# Patient Record
Sex: Male | Born: 1975 | Race: Black or African American | Hispanic: No | Marital: Single | Smoking: Former smoker
Health system: Southern US, Community
[De-identification: ages and names within clinical notes are randomized; demographics above are authoritative.]

---

## 2016-08-24 ENCOUNTER — Observation Stay (HOSPITAL_COMMUNITY)
Admission: EM | Admit: 2016-08-24 | Discharge: 2016-08-25 | Disposition: A | Discharge status: Home / Self Care | Payer: Self-pay | Attending: Internal Medicine | Admitting: Internal Medicine

## 2016-08-24 ENCOUNTER — Emergency Department (HOSPITAL_COMMUNITY): Payer: Self-pay

## 2016-08-24 ENCOUNTER — Encounter (HOSPITAL_COMMUNITY): Payer: Self-pay

## 2016-08-24 DIAGNOSIS — R000 Tachycardia, unspecified: Secondary | ICD-10-CM

## 2016-08-24 DIAGNOSIS — Z86711 Personal history of pulmonary embolism: Secondary | ICD-10-CM | POA: Diagnosis present

## 2016-08-24 DIAGNOSIS — J90 Pleural effusion, not elsewhere classified: Secondary | ICD-10-CM | POA: Insufficient documentation

## 2016-08-24 DIAGNOSIS — F17210 Nicotine dependence, cigarettes, uncomplicated: Secondary | ICD-10-CM | POA: Insufficient documentation

## 2016-08-24 DIAGNOSIS — D696 Thrombocytopenia, unspecified: Secondary | ICD-10-CM | POA: Insufficient documentation

## 2016-08-24 DIAGNOSIS — E871 Hypo-osmolality and hyponatremia: Secondary | ICD-10-CM | POA: Insufficient documentation

## 2016-08-24 DIAGNOSIS — I2699 Other pulmonary embolism without acute cor pulmonale: Principal | ICD-10-CM | POA: Insufficient documentation

## 2016-08-24 DIAGNOSIS — K449 Diaphragmatic hernia without obstruction or gangrene: Secondary | ICD-10-CM | POA: Insufficient documentation

## 2016-08-24 DIAGNOSIS — Z720 Tobacco use: Secondary | ICD-10-CM | POA: Diagnosis present

## 2016-08-24 LAB — BASIC METABOLIC PANEL
Anion gap: 10 (ref 5–15)
BUN: 10 mg/dL (ref 6–20)
CALCIUM: 9 mg/dL (ref 8.9–10.3)
CO2: 21 mmol/L — ABNORMAL LOW (ref 22–32)
Chloride: 101 mmol/L (ref 101–111)
Creatinine, Ser: 1.09 mg/dL (ref 0.61–1.24)
GFR calc Af Amer: >60 mL/min (ref >60)
GLUCOSE: 118 mg/dL — AB (ref 65–99)
POTASSIUM: 3.8 mmol/L (ref 3.5–5.1)
SODIUM: 132 mmol/L — AB (ref 135–145)

## 2016-08-24 LAB — CBC
HCT: 44.3 % (ref 39.0–52.0)
Hemoglobin: 15.5 g/dL (ref 13.0–17.0)
MCH: 32.1 pg (ref 26.0–34.0)
MCHC: 35 g/dL (ref 30.0–36.0)
MCV: 91.7 fL (ref 78.0–100.0)
PLATELETS: 141 10*3/uL — AB (ref 150–400)
RBC: 4.83 MIL/uL (ref 4.22–5.81)
RDW: 12.5 % (ref 11.5–15.5)
WBC: 11.9 10*3/uL — AB (ref 4.0–10.5)

## 2016-08-24 LAB — I-STAT TROPONIN, ED: TROPONIN I, POC: 0 ng/mL (ref 0.00–0.08)

## 2016-08-24 LAB — D-DIMER, QUANTITATIVE (NOT AT ARMC): D DIMER QUANT: 2.82 ug{FEU}/mL — AB (ref 0.00–0.50)

## 2016-08-24 MED ORDER — ONDANSETRON HCL 4 MG/2ML IJ SOLN: 4.0000 mg | Freq: Four times a day (QID) | INTRAMUSCULAR | Status: DC | PRN

## 2016-08-24 MED ORDER — FENTANYL CITRATE (PF) 100 MCG/2ML IJ SOLN
50.0000 ug | Freq: Once | INTRAMUSCULAR | Status: AC
  Administered 2016-08-24: 50 ug via INTRAVENOUS
  Filled 2016-08-24: qty 2

## 2016-08-24 MED ORDER — KETOROLAC TROMETHAMINE 30 MG/ML IJ SOLN
30.0000 mg | Freq: Four times a day (QID) | INTRAMUSCULAR | Status: DC | PRN
  Administered 2016-08-24 – 2016-08-25 (×3): 30 mg via INTRAVENOUS
  Filled 2016-08-24 (×3): qty 1

## 2016-08-24 MED ORDER — AZITHROMYCIN 250 MG PO TABS
500.0000 mg | ORAL_TABLET | Freq: Once | ORAL | Status: AC
  Administered 2016-08-24: 500 mg via ORAL
  Filled 2016-08-24: qty 2

## 2016-08-24 MED ORDER — HEPARIN (PORCINE) IN NACL 100-0.45 UNIT/ML-% IJ SOLN
1400.0000 [IU]/h | INTRAMUSCULAR | Status: DC
  Administered 2016-08-24: 1400 [IU]/h via INTRAVENOUS
  Filled 2016-08-24: qty 250

## 2016-08-24 MED ORDER — ONDANSETRON HCL 4 MG PO TABS: 4.0000 mg | ORAL_TABLET | Freq: Four times a day (QID) | ORAL | Status: DC | PRN

## 2016-08-24 MED ORDER — ENOXAPARIN SODIUM 100 MG/ML ~~LOC~~ SOLN
1.0000 mg/kg | Freq: Two times a day (BID) | SUBCUTANEOUS | Status: DC
  Administered 2016-08-24 – 2016-08-25 (×2): 85 mg via SUBCUTANEOUS
  Filled 2016-08-24 (×2): qty 1

## 2016-08-24 MED ORDER — IOPAMIDOL (ISOVUE-370) INJECTION 76%
INTRAVENOUS | Status: AC
  Administered 2016-08-24: 100 mL
  Filled 2016-08-24: qty 100

## 2016-08-24 MED ORDER — HEPARIN BOLUS VIA INFUSION
6000.0000 [IU] | Freq: Once | INTRAVENOUS | Status: AC
  Administered 2016-08-24: 6000 [IU] via INTRAVENOUS
  Filled 2016-08-24: qty 6000

## 2016-08-24 NOTE — ED Notes (Signed)
Attempted report x1. 

## 2016-08-24 NOTE — ED Provider Notes (Signed)
[facility]EMERGENCY DEPT Provider Note   CSN: [id number] Arrival date & time: 08/24/16  0203  By signing my name below, I, Elder Negus, attest that this documentation has been prepared under the direction and in the presence of Zadie Rhine, MD. Electronically Signed: Elder Negus, Scribe. 08/24/16. 3:30 AM.   History   Chief Complaint Chief Complaint  Patient presents with  . Chest Pain    HPI James Dennis is a 41 y.o. male without any chronic medical problems who presents to the ED for evaluation of rib pain. This patient states that he woke from sleep around 3 hours ago with sudden onset of L rib pain that worsens while breathing deeply. However no fevers or chills. No cough or hemoptysis. No trauma. No recent travel or leg swelling. No rhinorrhea or sore throat. He is a tobacco user. His course is worsening His pain is worse with breathing, improved with breath  The history is provided by the patient. No language interpreter was used.    PMH - none Soc hx - smoker Home Medications    Prior to Admission medications   Not on File    Family History No family history on file.  Social History Social History  Substance Use Topics  . Smoking status: Current Every Day Smoker    Packs/day: 0.50  . Smokeless tobacco: Never Used  . Alcohol use Yes     Allergies   Patient has no known allergies.   Review of Systems Review of Systems  Constitutional: Negative for fever.  HENT: Negative for rhinorrhea and sore throat.   Respiratory: Negative for cough and shortness of breath.        L rib pain. No hemoptysis  Cardiovascular: Negative for chest pain (anterior) and leg swelling.  All other systems reviewed and are negative.    Physical Exam Updated Vital Signs BP 127/87 (BP Location: Left Arm)   Pulse 97   Temp 99 F (37.2 C) (Oral)   Resp 18   Ht 5\' 11"  (1.803 m)   Wt 190 lb (86.2 kg)   SpO2 97%   BMI 26.50 kg/m   Physical Exam CONSTITUTIONAL:  Well developed/well nourished HEAD: Normocephalic/atraumatic EYES: EOMI ENMT: Mucous membranes moist NECK: supple no meningeal signs SPINE/BACK:entire spine nontender CV: S1/S2 noted, no murmurs/rubs/gallops noted LUNGS: Lungs are clear to auscultation bilaterally, no apparent distress ABDOMEN: soft, nontender, no rebound or guarding, bowel sounds noted throughout abdomen GU:no cva tenderness NEURO: Pt is awake/alert/appropriate, moves all extremitiesx4.  EXTREMITIES: pulses normal/equal, full ROM, no calf tenderness or edema SKIN: warm, color normal PSYCH: no abnormalities of mood noted, alert and oriented to situation   ED Treatments / Results  Labs (all labs ordered are listed, but only abnormal results are displayed) Labs Reviewed  BASIC METABOLIC PANEL - Abnormal; Notable for the following:       Result Value   Sodium 132 (*)    CO2 21 (*)    Glucose, Bld 118 (*)    All other components within normal limits  CBC - Abnormal; Notable for the following:    WBC 11.9 (*)    Platelets 141 (*)    All other components within normal limits  D-DIMER, QUANTITATIVE (NOT AT [facility]) - Abnormal; Notable for the following:    D-Dimer, Quant 2.82 (*)    All other components within normal limits  HEPARIN LEVEL (UNFRACTIONATED)  Rosezena Sensor, ED    EKG  EKG Interpretation  Date/Time:  Thursday August 24 2016 02:11:11 EDT  Ventricular Rate:  102 PR Interval:  164 QRS Duration: 82 QT Interval:  314 QTC Calculation: 409 R Axis:   71 Text Interpretation:  Sinus tachycardia Cannot rule out Anterior infarct , age undetermined Abnormal ECG No previous ECGs available Confirmed by Bebe ShaggyWICKLINE  MD, Kennedie Pardoe ([id number]) on 08/24/2016 3:24:13 AM       Radiology Dg Chest 2 View  Result Date: 08/24/2016 CLINICAL DATA:  41 y/o  M; left-sided chest pain. EXAM: CHEST  2 VIEW COMPARISON:  None. FINDINGS: Normal cardiac silhouette. Ill-defined left lower lobe consolidation. Probable small left pleural  effusion. No acute osseous abnormality is evident. IMPRESSION: Ill-defined left lower lobe consolidation, probably pneumonia. Small left pleural effusion. These results will be called to the ordering clinician or representative by the Radiologist Assistant, and communication documented in the PACS or zVision Dashboard. Electronically Signed   By: Mitzi HansenLance  Furusawa-Stratton M.D.   On: 08/24/2016 02:48   Ct Angio Chest Pe W And/or Wo Contrast  Result Date: 08/24/2016 CLINICAL DATA:  41 y/o  M; pleuritic chest pain. EXAM: CT ANGIOGRAPHY CHEST WITH CONTRAST TECHNIQUE: Multidetector CT imaging of the chest was performed using the standard protocol during bolus administration of intravenous contrast. Multiplanar CT image reconstructions and MIPs were obtained to evaluate the vascular anatomy. CONTRAST:  71 cc Isovue 370 COMPARISON:  08/24/2016 chest radiograph. FINDINGS: Cardiovascular: Positive for acute pulmonary embolus in multiple lobar and segmental pulmonary arteries bilaterally. RV/LV ratio equals 0.82. Normal caliber thoracic aorta. Normal heart size. No pericardial effusion. Mediastinum/Nodes: No enlarged mediastinal, hilar, or axillary lymph nodes. Thyroid gland, trachea, and esophagus demonstrate no significant findings. Lungs/Pleura: Consolidation within the left lower lobe does appear to have a wedge-shaped configuration on the coronal reconstruction may represent a small pulmonary infarct. Pneumonia is not excluded. Lungs are otherwise clear. Small left pleural effusion. No pneumothorax. Upper Abdomen: Small hiatal hernia. Musculoskeletal: No chest wall abnormality. No acute or significant osseous findings. Review of the MIP images confirms the above findings. IMPRESSION: 1. Positive for acute pulmonary embolus. No right ventricular strain by CT criteria. 2. Left lower lobe consolidation has a wedge-shaped configuration on coronal reconstruction and may represent a small pulmonary infarct. Pneumonia is  also possible. 3. Small hiatal hernia. These results were called by telephone at the time of interpretation on 08/24/2016 at 6:27 am to Dr. Zadie RhineNALD Arrianna Catala , who verbally acknowledged these results. Electronically Signed   By: Mitzi HansenLance  Furusawa-Stratton M.D.   On: 08/24/2016 06:28    Procedures Procedures  CRITICAL CARE Performed by: Joya GaskinsWICKLINE,Jonthan Leite W Total critical care time: 33 minutes Critical care time was exclusive of separately billable procedures and treating other patients. Critical care was necessary to treat or prevent imminent or life-threatening deterioration. Critical care was time spent personally by me on the following activities: development of treatment plan with patient and/or surrogate as well as nursing, discussions with consultants, evaluation of patient's response to treatment, examination of patient, obtaining history from patient or surrogate, ordering and performing treatments and interventions, ordering and review of laboratory studies, ordering and review of radiographic studies, pulse oximetry and re-evaluation of patient's condition. PATIENT WITH ACUTE PULMONARY EMBOLISM REQUIRING ADMISSION AND IV HEPARIN  Medications Ordered in ED Medications  heparin ADULT infusion 100 units/mL (25000 units/25750mL sodium chloride 0.45%) (1,400 Units/hr Intravenous New Bag/Given 08/24/16 0717)  azithromycin (ZITHROMAX) tablet 500 mg (500 mg Oral Given 08/24/16 0336)  iopamidol (ISOVUE-370) 76 % injection (100 mLs  Contrast Given 08/24/16 0603)  fentaNYL (SUBLIMAZE) injection 50 mcg (50 mcg Intravenous  Given 08/24/16 0715)  heparin bolus via infusion 6,000 Units (6,000 Units Intravenous Bolus from Bag 08/24/16 0717)     Initial Impression / Assessment and Plan / ED Course  I have reviewed the triage vital signs and the nursing notes.  Pertinent labs & imaging results that were available during my care of the patient were reviewed by me and considered in my medical decision making (see  chart for details).     PT WITH ACUTE PE HIS ONLY KNOWN RISK FACTOR IS SMOKING HE HAS MILD HYPOXIA THAT IMPROVED HE IS HEMODYNAMICALLY APPROPRIATE WILL NEED ADMISSION D/W INTERNAL MEDICINE TEAM FOR ADMISSION  PATIENT AGREEABLE WITH PLAN   Final Clinical Impressions(s) / ED Diagnoses   Final diagnoses:  Other acute pulmonary embolism without acute cor pulmonale (HCC)    New Prescriptions New Prescriptions   No medications on file  I personally performed the services described in this documentation, which was scribed in my presence. The recorded information has been reviewed and is accurate.       Zadie Rhine, MD 08/24/16 [phone]

## 2016-08-24 NOTE — H&P (Signed)
Date: 08/24/2016               Patient Name:  James Dennis MRN: [id number]  DOB: Oct 12, 1975 Age / Sex: 41 y.o., male   PCP: No Pcp Per Patient         Medical Service: Internal Medicine Teaching Service         Attending Physician: Dr. Earl LagosNischal Narendra, MD    First Contact: Dr. Docia FurlMatt O'Sullivan Pager: [phone]  Second Contact: Dr. John GiovanniVasundhra Rathore Pager: [phone]       After Hours (After 5p/  First Contact Pager: [phone]  weekends / holidays): Second Contact Pager: [phone]   Chief Complaint: chest pain  History of Present Illness: Mr. James Dennis is a 41 year old man who awoke late last night with left-sided, sharp chest pain. His pain improved with a warm cloth though returned as soon as he took it off and lay down. He felt short of breath when he coughed, but he proceeded to the ED for further evaluation. He denies any preceding symptoms, like fever/chills, sick contacts, syncope, leg swelling, change in appetite, family history of blood clots, recent travel, surgery,  Immobilization. He only takes a multivitamin daily but denies any herbal or vitamin supplements. He lives at home with his male partner and their children and grandchildren. He enjoys playing basketball in his free time. He has smoked 4-6 cigarettes/day since the age of 41 and drinks 1 beer daily and shots of whiskey weekly though denies any illicit drug use.    In the ED, initial labwork was notable for hyponatremia 132, low bicarb 21, leukocytosis 11.9, thrombocytopenia 141, elevated D-dimer 2.82, initial troponin 0. CXR initially noted opacity in left lower lung concerning for pneumonia but CTA revealed pulmonary emboli in multiple lobar and segmental arteries bilaterally with associated infarction of the left lower lobe.  He received azithromycin 500 mg, fentanyl 50 mcg injection, heparin IV.  Meds:  Current Meds  Medication Sig  . Multiple Vitamin (MULTIVITAMIN WITH MINERALS) TABS tablet Take 1 tablet by mouth  daily. gummy   Allergies: Allergies as of 08/24/2016  . (No Known Allergies)   History reviewed. No pertinent past medical history.  Family History: As noted in the HPI.  Social History: As noted in the HPI.  Review of Systems: A complete ROS was negative except as per HPI.   Physical Exam: Blood pressure 131/85, pulse 95, temperature 99 F (37.2 C), temperature source Oral, resp. rate (!) 29, height 5\' 11"  (1.803 m), weight 190 lb (86.2 kg), SpO2 99 %. Physical Exam  Constitutional: He is oriented to person, place, and time. No distress.  HENT:  Head: Normocephalic and atraumatic.  Eyes: Conjunctivae are normal. No scleral icterus.  Neck: No JVD present.  Cardiovascular: Regular rhythm and intact distal pulses.  Exam reveals no gallop and no friction rub.   No murmur heard. Tachycardic  Pulmonary/Chest: No respiratory distress. He has no wheezes.  Inspiratory effort limited by pain  Abdominal: Soft. He exhibits no distension.  Neurological: He is alert and oriented to person, place, and time.  Skin: Skin is warm and dry. He is not diaphoretic.    EKG: I reviewed and compared with none prior.  Normal axis, sinus tachycardia. Q waves in II, III, avF. ST depressions in III.  CXR: I reviewed and compared with none prior. Interpretation limited by inspiratory effort and overpenetration. Opacity noted in the left base..   Assessment & Plan by Problem: Active Problems:  Pulmonary embolism with infarction [facility])  Mr. Eskew is a 41 year old man with tobacco abuse hospitalized for acute unprovoked pulmonary emboli and associated left lung infarction.  Acute unprovoked pulmonary emboli and associated left lung infarction: Only risk factor thus far is tobacco abuse though is not at a quantity which could account for his presentation. He continues to require supplemental oxygen and is mildly tachycardic for which observation is warranted. -Transition heparin IV to oral factor Xa  inhibitor tomorrow. -Consult pharmacy to assist with medication cost. -Give ketorolac 30 mg IV every 6 hours as needed for pain. -Encourage tobacco cessation on discharge.  #FEN:  -Diet: Regular  #DVT prophylaxis: heparin IV  #CODE STATUS: FULL CODE -Defer to mother Tamera Stands  [phone]] if patients lacks decision-making capacity -Confirmed with patient on admission  Dispo: Admit patient to Observation with expected length of stay less than 2 midnights.  Signed: Beather Arbour, MD 08/24/2016, 9:34 AM  Pager: [phone]

## 2016-08-24 NOTE — Progress Notes (Signed)
ANTICOAGULATION CONSULT NOTE - Initial Consult  Pharmacy Consult for Heparin > Lovenox Indication: pulmonary embolus  No Known Allergies  Patient Measurements: Height: 5\' 11"  (180.3 cm) Weight: 190 lb (86.2 kg) IBW/kg (Calculated) : 75.3  Vital Signs: Temp: 99 F (37.2 C) (03/29 0306) Temp Source: Oral (03/29 0306) BP: 125/80 (03/29 1200) Pulse Rate: 95 (03/29 1200)  Labs:  Recent Labs  08/24/16 0216  HGB 15.5  HCT 44.3  PLT 141*  CREATININE 1.09    Estimated Creatinine Clearance: 95.9 mL/min (by C-G formula based on SCr of 1.09 mg/dL).   Assessment: 41 y/o M with no significant medical history here with new onset rib pain, found to have new onset PE on CT angio and started on heparin. Pharmacy now consulted to transition from heparin to Lovenox. Hg wnl, plt 141, CrCL~95. No bleed documented.   Goal of Therapy:  Anti-Xa level 0.6-1 units/ml 4hrs after LMWH dose given Monitor platelets by anticoagulation protocol: Yes   Plan:  D/c IV heparin Lovenox 85mg  (~1mg /kg) [place] q12h - start 1 hour after heparin drip d/c'd (communicated with RN) Monitor CBC at least q72h while on Lovenox, s/sx bleeding   Babs BertinHaley Shanequia Kendrick, PharmD, BCPS Clinical Pharmacist 08/24/2016 1:43 PM

## 2016-08-24 NOTE — Progress Notes (Signed)
ANTICOAGULATION CONSULT NOTE - Initial Consult  Pharmacy Consult for Heparin  Indication: pulmonary embolus  No Known Allergies  Patient Measurements: Height: 5\' 11"  (180.3 cm) Weight: 190 lb (86.2 kg) IBW/kg (Calculated) : 75.3  Vital Signs: Temp: 99 F (37.2 C) (03/29 0306) Temp Source: Oral (03/29 0306) BP: 134/93 (03/29 60450637) Pulse Rate: 90 (03/29 0637)  Labs:  Recent Labs  08/24/16 0216  HGB 15.5  HCT 44.3  PLT 141*  CREATININE 1.09    Estimated Creatinine Clearance: 95.9 mL/min (by C-G formula based on SCr of 1.09 mg/dL).   Assessment: 41 y/o M with no significant medical history here with new onset rib pain, found to have new onset PE on CT angio, starting heparin, CBC/renal function good, PTA meds reviewed.   Goal of Therapy:  Heparin level 0.3-0.7 units/ml Monitor platelets by anticoagulation protocol: Yes   Plan:  Heparin 6000 units BOLUS Start heparin drip at 1400 units/hr 1500 HL Daily CBC/HL Monitor for bleeding   Abran DukeLedford, Shamiah Kahler 08/24/2016,6:39 AM

## 2016-08-24 NOTE — ED Notes (Signed)
[facility] : left lower lobe ill defined consolidation , probable pneumonia , small left pleural effusion .

## 2016-08-24 NOTE — ED Notes (Signed)
Ordered heart healthy breakfast tray

## 2016-08-24 NOTE — Progress Notes (Addendum)
Transitions of Care Pharmacy Note  Follow-Up Educated patient on new Xarelto Provided patient with 30 day free card  Provided patient with information on how to enroll in Xarelto [facility] Instructed patient to follow-up with pharmacist in Internal Medicine Clinic to help with cost of medications, including medications for smoking cessation.  [name], PharmD PGY1 Pharmacy Resident Pager: [phone]  08/25/2016 2:12 PM    Plan:  Educated on importance of anticoagulation to resolve PE, importance of smoking cessation Recommend consult to care management for affordable PO AC option Follow-up choice of AC and counseling as appropriate --------------------------------------------- James Dennis is an 41 y.o. male who presents with a chief complaint SOB> Pulmonary embolism. In anticipation of discharge, pharmacy has reviewed this patient's prior to admission medication history, as well as [facility] inpatient [facility]  Current medication indications, dosing, frequency, and notable side effects reviewed with patient and family. patient and family verbalized understanding of current inpatient medication regimen and are aware that the After Visit Summary when presented, will represent the most accurate medication list at discharge.   James Couriererek A Quinter expressed no concerns. Patient does not have Rx drug insurance, recommend consult to care management to assess eligibility for PAP or ?samples from IM clinic. Patient and partner appear motivated to quit smoking.   Assessment: Understanding of regimen: unable to assess at this time as anticoagulant has not been chosen yet  Understanding of indications: unable to assess at this time as anticoagulant has not been chosen yet  Potential of compliance: good Barriers to Obtaining Medications: Yes  Patient instructed to contact inpatient pharmacy team with further questions or concerns if needed.    Time spent preparing for  discharge counseling: 10 mins Time spent counseling patient: 15 mins   Thank you for allowing pharmacy to be a part of this patient's care.  [name], Pharm.D. PGY1 Pharmacy Resident 3/29/20187:03 PM Pager [phone]

## 2016-08-24 NOTE — ED Notes (Signed)
Patient transported to CT 

## 2016-08-24 NOTE — ED Notes (Signed)
Pt denies CP at this time 

## 2016-08-24 NOTE — ED Notes (Signed)
Per Hope, RN, pharmacy instructed lovenox to be administered approx 1 hr after heparin drip stopped.

## 2016-08-24 NOTE — ED Triage Notes (Signed)
Pain to left ribcage that woke him out of his sleep. Pt states pain is sharp and worse with movement. Endorses sob with pain. No n/v, jaw pain, or arm pain. Pt is diaphoretic in triage

## 2016-08-25 ENCOUNTER — Observation Stay (HOSPITAL_BASED_OUTPATIENT_CLINIC_OR_DEPARTMENT_OTHER): Payer: Self-pay

## 2016-08-25 DIAGNOSIS — F17200 Nicotine dependence, unspecified, uncomplicated: Secondary | ICD-10-CM

## 2016-08-25 DIAGNOSIS — I2699 Other pulmonary embolism without acute cor pulmonale: Secondary | ICD-10-CM

## 2016-08-25 LAB — CBC
HEMATOCRIT: 42.6 % (ref 39.0–52.0)
HEMOGLOBIN: 14.4 g/dL (ref 13.0–17.0)
MCH: 31.3 pg (ref 26.0–34.0)
MCHC: 33.8 g/dL (ref 30.0–36.0)
MCV: 92.6 fL (ref 78.0–100.0)
Platelets: 151 10*3/uL (ref 150–400)
RBC: 4.6 MIL/uL (ref 4.22–5.81)
RDW: 12.6 % (ref 11.5–15.5)
WBC: 8.6 10*3/uL (ref 4.0–10.5)

## 2016-08-25 LAB — ECHOCARDIOGRAM COMPLETE
HEIGHTINCHES: 71 in
Weight: 3040 oz

## 2016-08-25 LAB — HIV ANTIBODY (ROUTINE TESTING W REFLEX): HIV SCREEN 4TH GENERATION: NONREACTIVE

## 2016-08-25 MED ORDER — RIVAROXABAN (XARELTO) VTE STARTER PACK (15 & 20 MG): ORAL_TABLET | ORAL | 0 refills | Status: AC

## 2016-08-25 MED ORDER — RIVAROXABAN 15 MG PO TABS
15.0000 mg | ORAL_TABLET | Freq: Two times a day (BID) | ORAL | Status: DC
Start: 2016-08-25 — End: 2016-08-25
  Administered 2016-08-25: 15 mg via ORAL
  Filled 2016-08-25: qty 1

## 2016-08-25 NOTE — Progress Notes (Addendum)
[place] for Heparin > Lovenox > Xarelto Indication: pulmonary embolus   Assessment: 42 y/o M with no significant medical history here with new onset rib pain, found to have new onset PE on CT angio now transitioning to Xarelto  Last dose of Lovenox at 2 am  Goal of Therapy:   Monitor platelets by anticoagulation protocol: Yes   Plan:  DC Lovenox  Xarelto 15 mg po BID x 3 weeks, then 20 mg po daily to start this afternoon I have pended discharge order for the VTE medication kit at discharge    No Known Allergies  Patient Measurements: Height: '5\' 11"'$  (180.3 cm) Weight: 190 lb (86.2 kg) IBW/kg (Calculated) : 75.3  Vital Signs: Temp: 100.1 F (37.8 C) (03/30 0506) Temp Source: Oral (03/30 0506) BP: 126/65 (03/30 0506) Pulse Rate: 95 (03/30 0506)  Labs:  Recent Labs  08/24/16 0216 08/25/16 0211  HGB 15.5 14.4  HCT 44.3 42.6  PLT 141* 151  CREATININE 1.09  --     Estimated Creatinine Clearance: 95.9 mL/min (by C-G formula based on SCr of 1.09 mg/dL).    Thank you Anette Guarneri, PharmD [phone] 08/25/2016 11:10 AM

## 2016-08-25 NOTE — Progress Notes (Signed)
Iv removed. No issues present. D/c instructions given to patient. Escorted out via wheelchair.  Jameka Ivie, Charlaine Dalton RN

## 2016-08-25 NOTE — Care Management Note (Signed)
Case Management Note Donn Pierini RN, BSN Unit 2W-Case Manager [phone]   Patient Details  Name: James Dennis MRN: [id number] Date of Birth: 1975-08-03  Subjective/Objective:  Pt admitted with PE                Action/Plan: PTA pt lived at home per MD plan to d/c on Xarelto- spoke with pt at bedside 30 day free card given to pt to use on discharge- along with application for pt assistance for further assistance- [facility] and will take application with him to f/u for MD to sign and then can fax. Have instructed pt to have his starter pack filled at the [facility] on [address] with the 30 day free card.   Expected Discharge Date:  08/25/16               Expected Discharge Plan:  Home/Self Care  In-House Referral:     Discharge planning Services  CM Consult, Medication Assistance  Post Acute Care Choice:  NA Choice offered to:  NA  DME Arranged:    DME Agency:     HH Arranged:    HH Agency:     Status of Service:  Completed, signed off  If discussed at [employer] of Stay Meetings, dates discussed:    Additional Comments:  Darrold Span, RN 08/25/2016, 1:13 PM

## 2016-08-25 NOTE — Progress Notes (Signed)
Pt has no insurance and can be assisted with any of the noacs with a 30 day free card and given the pt assistance application for whichever once is selected- CM will follow for which one is chosen and give pt needed info on selected drug.

## 2016-08-25 NOTE — Discharge Instructions (Addendum)
You were admitted to the hospital for a blood clot in your lungs (pulmonary embolism).  We started treating you with blood thinners, and I have prescribed you a blood thinner, Xarelto, to continue treatment when you go home.  It is very important that you start taking this immediately and as prescribed, as this condition can be fatal if untreated.    You will take Xarelto 15 mg twice per day with food starting tomorrow for the first 3 weeks, then 20 mg once daily with food every day after that.  Be sure to follow-up in the internal medicine center.  If you have new chest pain, shortness of breath, or pain or swelling in one leg, please come back to the ED.   Pulmonary Embolism A pulmonary embolism (PE) is a sudden blockage or decrease of blood flow in one lung or both lungs. Most blockages come from a blood clot that travels from the legs or the pelvis to the lungs. PE is a dangerous and potentially life-threatening condition if it is not treated right away. What are the causes? A pulmonary embolism occurs most commonly when a blood clot travels from one of your veins to your lungs. Rarely, PE is caused by air, fat, amniotic fluid, or part of a tumor traveling through your veins to your lungs. What increases the risk? A PE is more likely to develop in:  People who smoke.  People who areolder, especially over 16 years of age.  People who are overweight (obese).  People who sit or lie still for a long time, such as during long-distance travel (over 4 hours), bed rest, hospitalization, or during recovery from certain medical conditions like a stroke.  People who do not engage in much physical activity (sedentary lifestyle).  People who have chronic breathing disorders.  People whohave a personal or family history of blood clots or blood clotting disease.  People whohave peripheral vascular disease (PVD), diabetes, or some types of cancer.  People who haveheart disease, especially if  the person had a recent heart attack or has congestive heart failure.  People who have neurological diseases that affect the legs (leg paresis).  People who have had a traumatic injury, such as breaking a hip or leg.  People whohave recently had major or lengthy surgery, especially on the hip, knee, or abdomen.  People who have hada central line placed inside a large vein.  People who takemedicines that contain the hormone estrogen. These include birth control pills and hormone replacement therapy.  Pregnancy or during childbirth or the postpartum period. What are the signs or symptoms? The symptoms of a PE usually start suddenly and include:  Shortness of breath while active or at rest.  Coughing or coughing up blood or blood-tinged mucus.  Chest pain that is often worse with deep breaths.  Rapid or irregular heartbeat.  Feeling light-headed or dizzy.  Fainting.  Feelinganxious.  Sweating. There may also be pain and swelling in a leg if that is where the blood clot started. These symptoms may represent a serious problem that is an emergency. Do not wait to see if the symptoms will go away. Get medical help right away. Call your local emergency services (911 in the [place] Do not drive yourself to the hospital.  How is this diagnosed? Your health care provider will take a medical history and perform a physical exam. You may also have other tests, including:  Blood tests to assess the clotting properties of your blood, assess oxygen levels  in your blood, and find blood clots.  Imaging tests, such as CT, ultrasound, MRI, X-ray, and other tests to see if you have clots anywhere in your body.  An electrocardiogram (ECG) to look for heart strain from blood clots in the lungs. How is this treated? The main goals of PE treatment are:  To stop a blood clot from growing larger.  To stop new blood clots from forming. The type of treatment that you receive depends on many  factors, such as the cause of your PE, your risk for bleeding or developing more clots, and other medical conditions that you have. Sometimes, a combination of treatments is necessary. This condition may be treated with:  Medicines, including newer oral blood thinners (anticoagulants), warfarin, low molecular weight heparins, thrombolytics, or heparins.  Wearing compression stockings or using different types of devices.  Surgery (rare) to remove the blood clot or to place a filter in your abdomen to stop the blood clot from traveling to your lungs. Treatments for a PE are often divided into immediate treatment, long-term treatment (up to 3 months after PE), and extended treatment (more than 3 months after PE). Your treatment may continue for several months. This is called maintenance therapy, and it is used to prevent the forming of new blood clots. You can work with your health care provider to choose the treatment program that is best for you. What are anticoagulants?  Anticoagulants are medicines that treat PEs. They can stop current blood clots from growing and stop new clots from forming. They cannot dissolve existing clots. Your body dissolves clots by itself over time. Anticoagulants are given by mouth, by injection, or through an IV tube. What are thrombolytics?  Thrombolytics are clot-dissolving medicines that are used to dissolve a PE. They carry a high risk of bleeding, so they tend to be used only in severe cases or if you have very low blood pressure. Follow these instructions at home: If you are taking a newer oral anticoagulant:   Take the medicine every single day at the same time each day.  Understand what foods and drugs interact with this medicine.  Understand that there are no regular blood tests required when using this medicine.  Understandthe side effects of this medicine, including excessive bruising or bleeding. Ask your health care provider or pharmacist about other  possible side effects. If you are taking warfarin:   Understand how to take warfarin and know which foods can affect how warfarin works in Public relations account executive.  Understand that it is dangerous to taketoo much or too little warfarin. Too much warfarin increases the risk of bleeding. Too little warfarin continues to allow the risk for blood clots.  Follow your PT and INR blood testing schedule. The PT and INR results allow your health care provider to adjust your dose of warfarin. It is very important that you have your PT and INR tested as often as told by your health care provider.  Avoid major changes in your diet, or tell your health care provider before you change your diet. Arrange a visit with a registered dietitian to answer your questions. Many foods, especially foods that are high in vitamin K, can interfere with warfarin and affect the PT and INR results. Eat a consistent amount of foods that are high in vitamin K, such as:  Spinach, kale, broccoli, cabbage, collard greens, turnip greens, Brussels sprouts, peas, cauliflower, seaweed, and parsley.  Beef liver and pork liver.  Green tea.  Soybean oil.  Tell your health care provider about any and all medicines, vitamins, and supplements that you take, including aspirin and other over-the-counter anti-inflammatory medicines. Be especially cautious with aspirin and anti-inflammatory medicines. Do not take those before you ask your health care provider if it is safe to do so. This is important because many medicines can interfere with warfarin and affect the PT and INR results.  Do not start or stop taking any over-the-counter or prescription medicine unless your health care provider or pharmacist tells you to do so. If you take warfarin, you will also need to do these things:  Hold pressure over cuts for longer than usual.  Tell your dentist and other health care providers that you are taking warfarin before you have any procedures in which  bleeding may occur.  Avoid alcohol or drink very small amounts. Tell your health care provider if you change your alcohol intake.  Do not use tobacco products, including cigarettes, chewing tobacco, and e-cigarettes. If you need help quitting, ask your health care provider.  Avoid contact sports. General instructions   Take over-the-counter and prescription medicines only as told by your health care provider. Anticoagulant medicines can have side effects, including easy bruising and difficulty stopping bleeding. If you are prescribed an anticoagulant, you will also need to do these things:  Hold pressure over cuts for longer than usual.  Tell your dentist and other health care providers that you are taking anticoagulants before you have any procedures in which bleeding may occur.  Avoid contact sports.  Wear a medical alert bracelet or carry a medical alert card that says you have had a PE.  Ask your health care provider how soon you can go back to your normal activities. Stay active to prevent new blood clots from forming.  Make sure to exercise while traveling or when you have been sitting or standing for a long period of time. It is very important to exercise. Exercise your legs by walking or by tightening and relaxing your leg muscles often. Take frequent walks.  Wear compression stockings as told by your health care provider to help prevent more blood clots from forming.  Do not use tobacco products, including cigarettes, chewing tobacco, and e-cigarettes. If you need help quitting, ask your health care provider.  Keep all follow-up appointments with your health care provider. This is important. How is this prevented? Take these actions to decrease your risk of developing another PE:  Exercise regularly. For at least 30 minutes every day, engage in:  Activity that involves moving your arms and legs.  Activity that encourages good blood flow through your body by increasing your  heart rate.  Exercise your arms and legs every hour during long-distance travel (over 4 hours). Drink plenty of water and avoid drinking alcohol while traveling.  Avoid sitting or lying in bed for long periods of time without moving your legs.  Maintain a weight that is appropriate for your height. Ask your health care provider what weight is healthy for you.  If you are a woman who is over 65 years of age, avoid unnecessary use of medicines that contain estrogen. These include birth control pills.  Do not smoke, especially if you take estrogen medicines. If you need help quitting, ask your health care provider.  If you are at very high risk for PE, wear compression stockings.  If you recently had a PE, have regularly scheduled ultrasound testing on your legs to check for new blood clots. If you are  hospitalized, prevention measures may include:  Early walking after surgery, as soon as your health care provider says that it is safe.  Receiving anticoagulants to prevent blood clots. If you cannot take anticoagulants, other options may be available, such as wearing compression stockings or using different types of devices. Get help right away if:  You have new or increased pain, swelling, or redness in an arm or leg.  You have numbness or tingling in an arm or leg.  You have shortness of breath while active or at rest.  You have chest pain.  You have a rapid or irregular heartbeat.  You feel light-headed or dizzy.  You cough up blood.  You notice blood in your vomit, bowel movement, or urine.  You have a fever. These symptoms may represent a serious problem that is an emergency. Do not wait to see if the symptoms will go away. Get medical help right away. Call your local emergency services (911 in the [place] Do not drive yourself to the hospital. This information is not intended to replace advice given to you by your health care provider. Make sure you discuss any questions you  have with your health care provider. Document Released: 05/12/2000 Document Revised: 10/21/2015 Document Reviewed: 09/09/2014 Elsevier Interactive Patient Education  2017 [employer].   Information on my medicine - XARELTO (rivaroxaban)  This medication education was reviewed with me or my healthcare representative as part of my discharge preparation.   WHY WAS XARELTO PRESCRIBED FOR YOU? Xarelto was prescribed to treat blood clots that may have been found in the veins of your legs (deep vein thrombosis) or in your lungs (pulmonary embolism) and to reduce the risk of them occurring again.  What do you need to know about Xarelto? The starting dose is one 15 mg tablet taken TWICE daily with food for the FIRST 21 DAYS then on (enter date)  ***  the dose is changed to one 20 mg tablet taken ONCE A DAY with your evening meal.  DO NOT stop taking Xarelto without talking to the health care provider who prescribed the medication.  Refill your prescription for 20 mg tablets before you run out.  After discharge, you should have regular check-up appointments with your healthcare provider that is prescribing your Xarelto.  In the future your dose may need to be changed if your kidney function changes by a significant amount.  What do you do if you miss a dose? If you are taking Xarelto TWICE DAILY and you miss a dose, take it as soon as you remember. You may take two 15 mg tablets (total 30 mg) at the same time then resume your regularly scheduled 15 mg twice daily the next day.  If you are taking Xarelto ONCE DAILY and you miss a dose, take it as soon as you remember on the same day then continue your regularly scheduled once daily regimen the next day. Do not take two doses of Xarelto at the same time.   Important Safety Information Xarelto is a blood thinner medicine that can cause bleeding. You should call your healthcare provider right away if you experience any of the  following: ? Bleeding from an injury or your nose that does not stop. ? Unusual colored urine (red or dark brown) or unusual colored stools (red or black). ? Unusual bruising for unknown reasons. ? A serious fall or if you hit your head (even if there is no bleeding).  Some medicines may interact with Xarelto and  might increase your risk of bleeding while on Xarelto. To help avoid this, consult your healthcare provider or pharmacist prior to using any new prescription or non-prescription medications, including herbals, vitamins, non-steroidal anti-inflammatory drugs (NSAIDs) and supplements.  This website has more information on Xarelto: VisitDestination.com.br.

## 2016-08-25 NOTE — Progress Notes (Signed)
   Subjective: Feeling well, no dyspnea, palpitations, or substernal chest discomfort, trace left lateral rib pain, no longer pleuritic.  Objective:  Vital signs in last 24 hours: Vitals:   08/24/16 1633 08/24/16 1729 08/24/16 2143 08/25/16 0506  BP:  134/83 132/80 126/65  Pulse:  90 98 95  Resp:  Temp:  98.1 F (36.7 C) 99.2 F (37.3 C) 100.1 F (37.8 C)  TempSrc:  Oral Oral Oral  SpO2: 99% 96% 97% 98%  Weight:  190 lb (86.2 kg)    Height:       Physical Exam  Constitutional: He is oriented to person, place, and time. He appears well-developed and well-nourished. No distress.  Cardiovascular: Normal rate and regular rhythm.   Pulmonary/Chest: Effort normal and breath sounds normal.  Neurological: He is alert and oriented to person, place, and time.  Psychiatric: He has a normal mood and affect. His behavior is normal.   CBC Latest Ref Rng & Units 08/25/2016 08/24/2016  WBC 4.0 - 10.5 K/uL 8.6 11.9(H)  Hemoglobin 13.0 - 17.0 g/dL 16.1 09.6  Hematocrit 04.5 - 52.0 % 42.6 44.3  Platelets 150 - 400 K/uL 151 141(L)   EKG 08/24/2016 [id number]  Assessment/Plan:  Principal Problem:   Pulmonary embolism with infarction [facility]) Active Problems:   Tobacco abuse   41 y.o. male with unprovoked pulmonary embolism.  He is hemodynamically stable and breathing well on room air and symptoms improved.  He will require long-term anticoagulation, DOAC preferred, and regular follow-up.  #Pulmonary Embolism -lovenox 1 mg/kg BID -check echo for R heart strain and R valvular disease -case management consult for DOAC -[facility] follow-up -tobacco cessation  Fluids: none Diet: regular DVT Prophylaxis: lovenox Code Status: full  Dispo: Anticipated discharge today.  Alm Bustard, MD 08/25/2016, 6:38 AM Pager: [phone]

## 2016-08-25 NOTE — Discharge Summary (Signed)
Name: James Dennis MRN: [id number] DOB: 01/27/1976 41 y.o. PCP: No Pcp Per Patient  Date of Admission: 08/24/2016  3:04 AM Date of Discharge: 08/25/2016 Attending Physician: Earl Lagos, MD  Discharge Diagnosis:  Principal Problem:   Pulmonary embolism with infarction [facility]) Active Problems:   Tobacco abuse   Discharge Medications: Allergies as of 08/25/2016   No Known Allergies     Medication List    TAKE these medications   multivitamin with minerals Tabs tablet Take 1 tablet by mouth daily. gummy   Rivaroxaban 15 & 20 MG Tbpk Take as directed on package: Start with one  tablet by mouth twice a day with food. On Day 22, switch to one  tablet once a day with food.       Disposition and follow-up:   James Dennis was discharged from [facility] in Stable condition.  At the hospital follow up visit please address:  1.  Pulmonary Embolism.  Ask about dyspnea and chest pain, adherence to Xarelto.  Ask about bleeding complications. Prescribe Xarelto maintenance.  2.  Labs / imaging needed at time of follow-up: none  3.  Pending labs/ test needing follow-up: HIV  Follow-up Appointments: Follow-up Information    [facility]. Schedule an appointment as soon as possible for a visit in 1 week(s).   Why:  They will call you on Monday to schedule.  If you do not hear from them, please call. Contact information: [address] [place] [address] [phone]          Hospital Course by problem list: Principal Problem:   Pulmonary embolism with infarction [facility]) Active Problems:   Tobacco abuse   1. Unprovoked Pulmonary Embolism [name] is a healthy 41 year old man who presented with acute onset of pleuritic left-sided lateral chest pain and dyspnea, and CTA showed multiple bilateral emboli at the lobar and segmental levels.  He is a current smoker, but no provoking factors including  immobilization, surgery, and travel were identified and he has no family or personal history of thromboembolic disease.  He was hemodynamically stable, breathing well on room air, and no signs of RV strain on CTA or echocardiogram.  He was anticoagulate on heparin, then lovenox, and transitioned to rivaroxaban with first dose before discharge.  He was given a card for his first month of Xarelto and will follow-up in [facility].  Discharge Vitals:   BP 126/65 (BP Location: Left Arm)   Pulse 95   Temp 100.1 F (37.8 C) (Oral)   Resp 18   Ht  (1.803 m)   Wt 190 lb (86.2 kg)   SpO2 98%   BMI 26.50 kg/m   Pertinent Labs, Studies, and Procedures:   CTA Chest 08/24/2016 IMPRESSION: 1. Positive for acute pulmonary embolus. No right ventricular strain by CT criteria. 2. Left lower lobe consolidation has a wedge-shaped configuration on coronal reconstruction and may represent a small pulmonary infarct. Pneumonia is also possible. 3. Small hiatal hernia.  Echocardiogram 08/25/2016 EF 55%, normal wall motion, normal diastolic function, no valvular disease.  Discharge Instructions: Discharge Instructions    Diet - low sodium heart healthy    Complete by:  As directed    Increase activity slowly    Complete by:  As directed       You were admitted to the hospital for a blood clot in your lungs (pulmonary embolism).  We started treating you with blood thinners, and  I have prescribed you a blood thinner, Xarelto, to continue treatment when you go home.  It is very important that you start taking this immediately and as prescribed, as this condition can be fatal if untreated.    You will take Xarelto 15 mg twice per day with food for the first 3 weeks, then 20 mg once daily with food every day after.  Be sure to follow-up in the internal medicine center.  If you have new chest pain, shortness of breath, or pain or swelling in one leg, please come back to the ED. Signed: Alm Bustard,  MD 08/25/2016, 1:05 PM   Pager: [phone]

## 2016-08-25 NOTE — Progress Notes (Signed)
  Echocardiogram 2D Echocardiogram has been performed.  James Dennis 08/25/2016, 11:23 AM

## 2016-09-04 ENCOUNTER — Encounter: Payer: Self-pay | Admitting: Internal Medicine

## 2016-09-04 ENCOUNTER — Ambulatory Visit (INDEPENDENT_AMBULATORY_CARE_PROVIDER_SITE_OTHER): Payer: Self-pay | Admitting: Internal Medicine

## 2016-09-04 VITALS — BP 130/80 | HR 80 | Temp 98.0°F | Ht 71.0 in | Wt 185.9 lb

## 2016-09-04 DIAGNOSIS — I2699 Other pulmonary embolism without acute cor pulmonale: Secondary | ICD-10-CM

## 2016-09-04 DIAGNOSIS — Z720 Tobacco use: Secondary | ICD-10-CM

## 2016-09-04 DIAGNOSIS — F17211 Nicotine dependence, cigarettes, in remission: Secondary | ICD-10-CM

## 2016-09-04 DIAGNOSIS — Z5189 Encounter for other specified aftercare: Secondary | ICD-10-CM

## 2016-09-04 DIAGNOSIS — Z86711 Personal history of pulmonary embolism: Secondary | ICD-10-CM

## 2016-09-04 MED ORDER — RIVAROXABAN 20 MG PO TABS: 20.0000 mg | ORAL_TABLET | Freq: Every day | ORAL | 5 refills | Status: AC

## 2016-09-04 NOTE — Progress Notes (Signed)
   CC: Hospital follow-up for pulmonary embolism  HPI:  [name] is a 41 y.o. man with history of tobacco abuse presenting for hospital follow-up of a pulmonary embolism.  He was hospitalized from 3/29-3/30 after presenting with left sided, sharp chest pain and shortness of breath found to have multiple bilateral pulmonary emboli on CTA chest. He had no evidence of right ventricular strain. His PE was unprovoked. He was started on Xarelto and discharged home.  Today, he reports he is feeling well. He has been compliant with his Xarelto 15 mg twice daily. He denies any chest pain or shortness of breath. He denies any bleeding, including gum bleeding, melena, or hematochezia. He reports he stopped smoking on 3/27.   No past medical history on file.  Review of Systems:   General: Denies fever, chills, night sweats, changes in weight, changes in appetite HEENT: Denies headaches, ear pain, changes in vision, rhinorrhea, sore throat CV: Denies palpitations, orthopnea Pulm: Denies cough, wheezing GI: Denies abdominal pain, nausea, vomiting, diarrhea, constipation GU: Denies dysuria, hematuria, frequency Msk: Denies muscle cramps, joint pains Neuro: Denies weakness, numbness, tingling Skin: Denies rashes, bruising Psych: Denies depression, anxiety, hallucinations  Physical Exam:  Vitals:   09/04/16 1358  BP: 130/80  Pulse: 80  Temp: 98 F (36.7 C)  TempSrc: Oral  SpO2: 100%  Weight: 185 lb 14.4 oz (84.3 kg)  Height:  (1.803 m)   General: Well-nourished man in NAD CV: RRR, no m/g/r Pulm: CTA bilaterally, breaths non-labored  Skin: No bruising noted   Assessment & Plan:   See Encounters Tab for problem based charting.  Patient discussed with Dr. Josem Kaufmann

## 2016-09-04 NOTE — Assessment & Plan Note (Signed)
Patient had an unprovoked PE confirmed by CTA chest on 08/24/2016. He was started on Xarelto therapy and is tolerating this well without any bleeding complications. Gave him a prescription for Xarelto 20 mg daily to start once he runs out his current dosepack. He'll need to complete at least 6 months of anticoagulation. Further evaluation with a d-dimer while off of anticoagulation therapy for one month can be used to determine his risk of future thrombotic events. He will follow-up in 6 months.

## 2016-09-04 NOTE — Assessment & Plan Note (Addendum)
Patient quit smoking on 08/22/16. I congratulated him for this accomplishment and encouraged him to continue abstaining from tobacco use as this could be a risk factor for future blood clots.

## 2016-09-04 NOTE — Patient Instructions (Signed)
General Instructions: - Start Xarelto 20 mg daily on day 22 - Follow up in 6 months or sooner if any issues with bleeding   Thank you for bringing your medicines today. This helps [place] keep you safe from mistakes.   Progress Toward Treatment Goals:  No flowsheet data found.  Self Care Goals & Plans:  No flowsheet data found.  No flowsheet data found.   Care Management & Community Referrals:  No flowsheet data found.

## 2016-09-05 NOTE — Progress Notes (Signed)
Case discussed with Dr. Rivet at the time of the visit.  We reviewed the resident's history and exam and pertinent patient test results.  I agree with the assessment, diagnosis and plan of care documented in the resident's note. 

## 2016-09-12 ENCOUNTER — Telehealth: Payer: Self-pay

## 2016-09-13 NOTE — Progress Notes (Signed)
Contacting patient for follow up medication help. Unable to reach 

## 2016-10-20 ENCOUNTER — Telehealth: Payer: Self-pay | Admitting: *Deleted

## 2016-10-20 NOTE — Telephone Encounter (Signed)
Called pt's home, sig other states she will come back if she gets a ride and pick up rivaroxaban

## 2017-12-06 IMAGING — CT CT ANGIO CHEST
2 of 6 series · 18 of 36 positions shown · IV contrast (Omni 300)
Comparison: 08/24/2016 chest radiograph.

CLINICAL DATA: 41 y/o  M; pleuritic chest pain.

EXAM:
CT ANGIOGRAPHY CHEST WITH CONTRAST
TECHNIQUE: Multidetector CT imaging of the chest was performed using the
standard protocol during bolus administration of intravenous
contrast. Multiplanar CT image reconstructions and MIPs were
obtained to evaluate the vascular anatomy.
CONTRAST:  71 cc Isovue 370

[Series 7: pe thins · axial · 0.72mm/px · z∈[+1178,+1402]mm · 17 of 253 slices shown]
[im 15/253  lung]
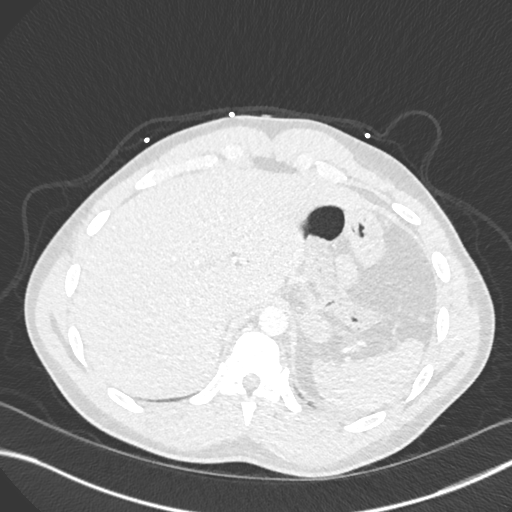
[im 29/253  mediastinal]
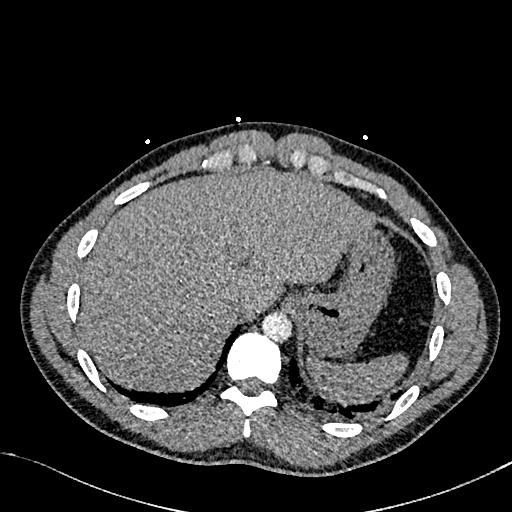
[im 43/253  lung]
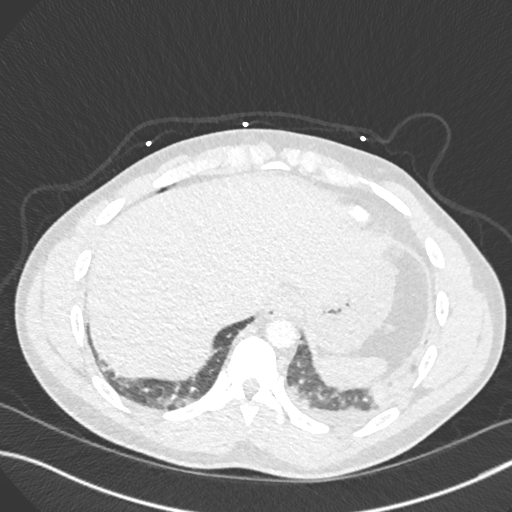
[im 57/253  mediastinal]
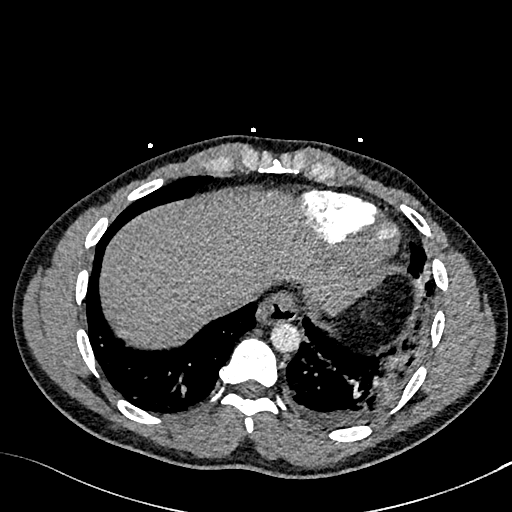
[im 71/253  lung]
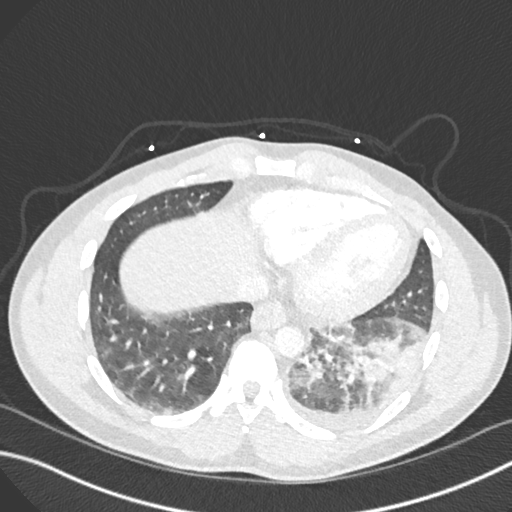
[im 85/253  mediastinal]
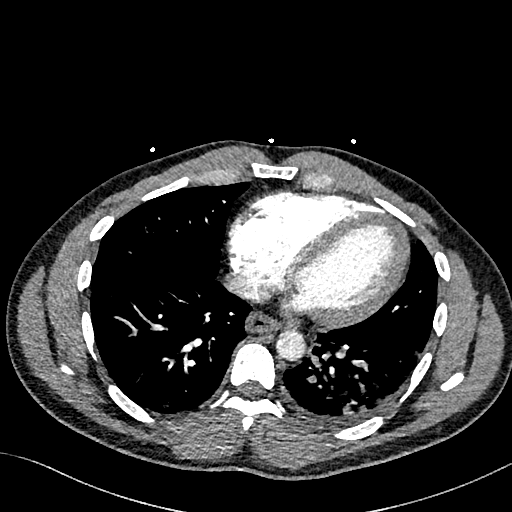
[im 99/253  lung]
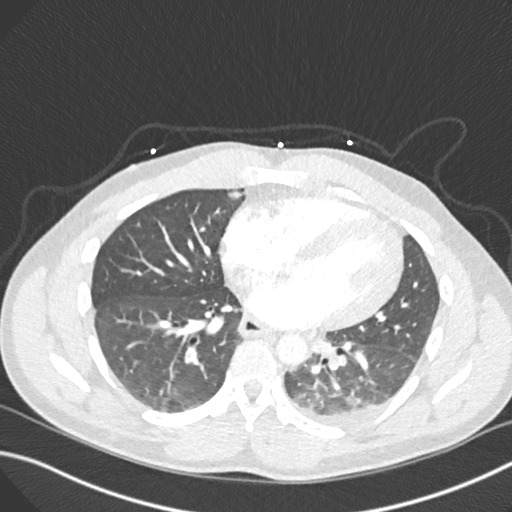
[im 113/253  mediastinal]
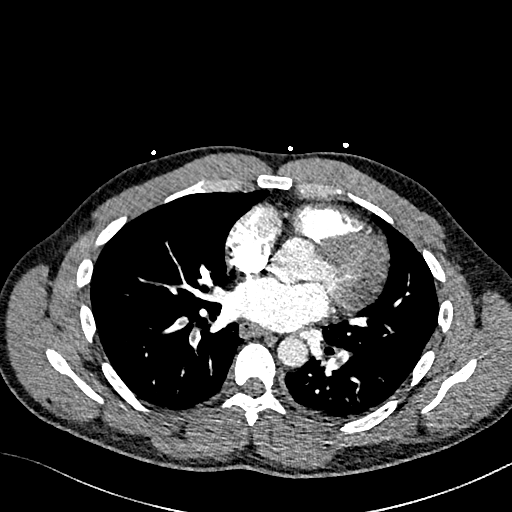
[im 127/253  lung]
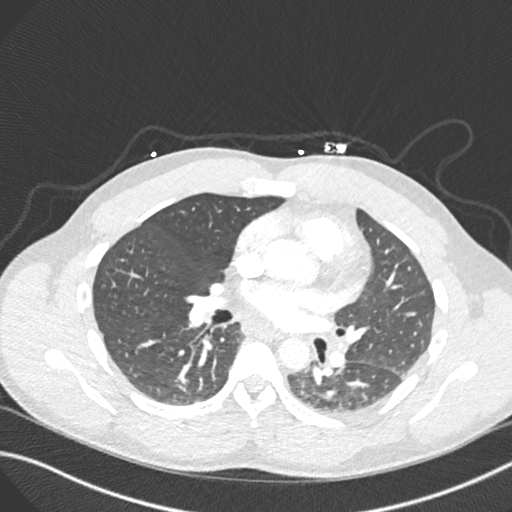
[im 141/253  mediastinal]
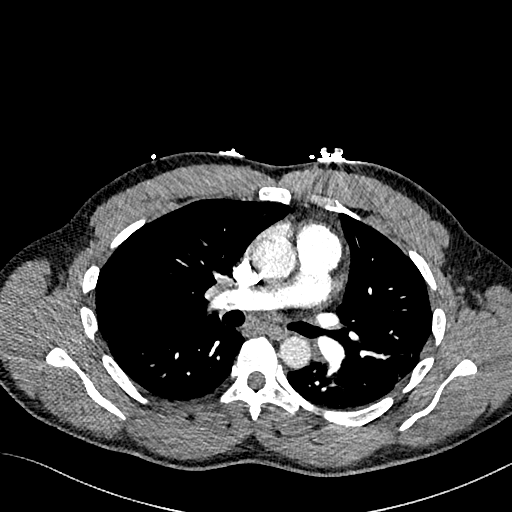
[im 155/253  lung]
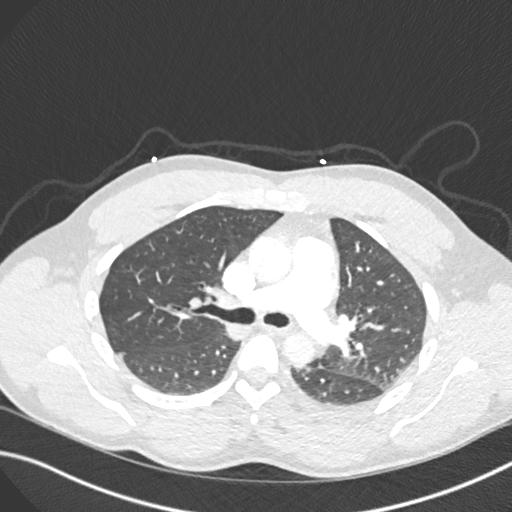
[im 169/253  mediastinal]
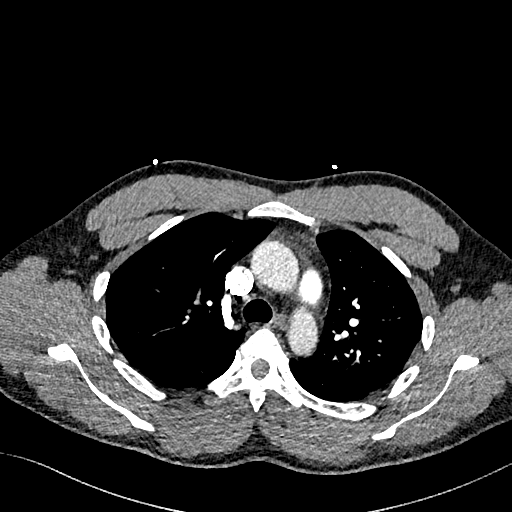
[im 183/253  lung]
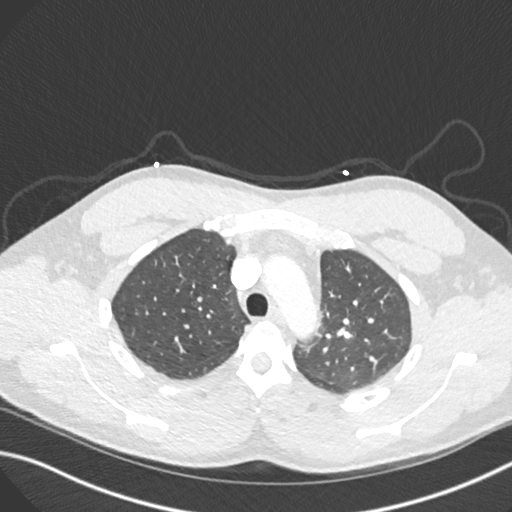
[im 197/253  mediastinal]
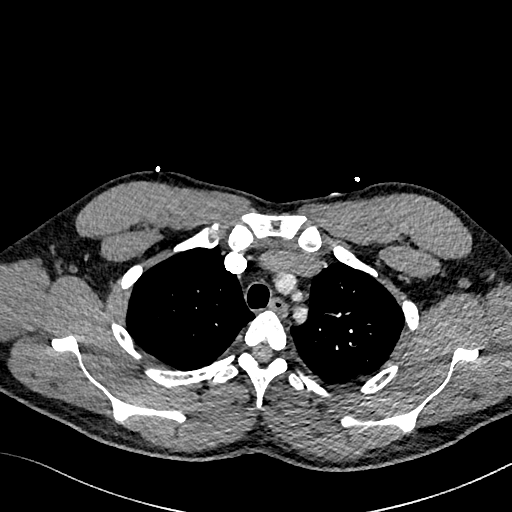
[im 211/253  lung]
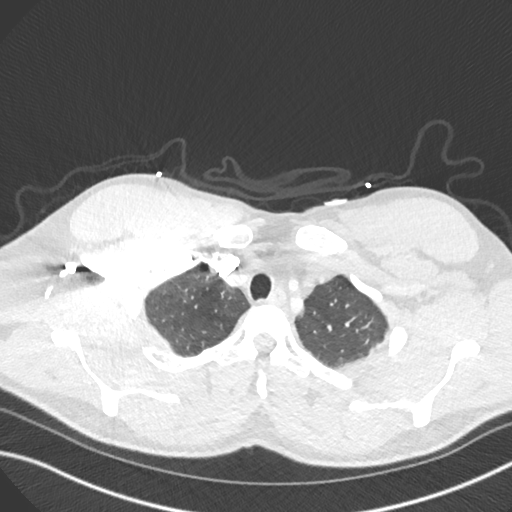
[im 225/253  mediastinal]
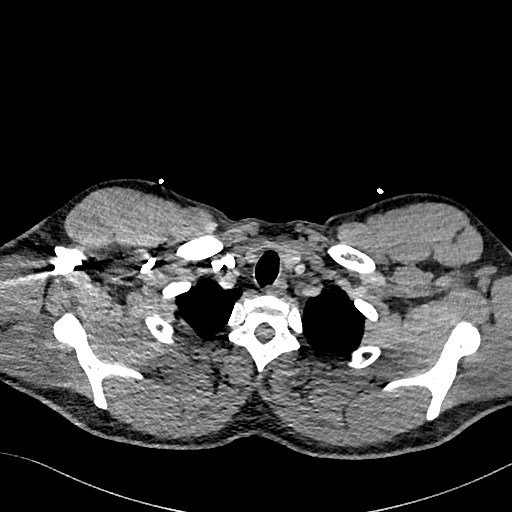
[im 239/253  lung]
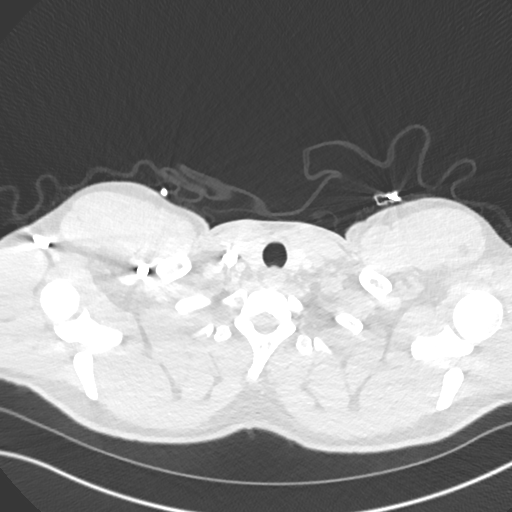

[Series 8: pe 2mm cor · coronal · 0.51mm/px · 1 of 113 slices shown]
[im 57/113  mediastinal]
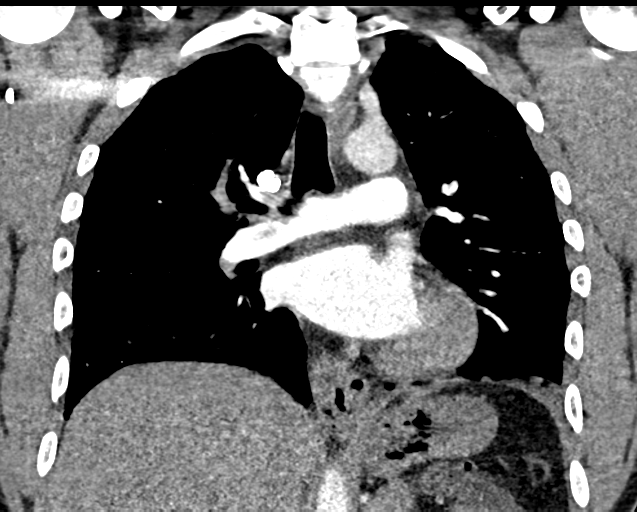

[18 of 36 positions shown; findings below may reference images not displayed]

FINDINGS: Cardiovascular: Positive for acute pulmonary embolus in multiple
lobar and segmental pulmonary arteries bilaterally. RV/LV ratio
equals 0.82. Normal caliber thoracic aorta. Normal heart size. No
pericardial effusion.

Mediastinum/Nodes: No enlarged mediastinal, hilar, or axillary lymph
nodes. Thyroid gland, trachea, and esophagus demonstrate no
significant findings.

Lungs/Pleura: Consolidation within the left lower lobe does appear
to have a wedge-shaped configuration on the coronal reconstruction
may represent a small pulmonary infarct. Pneumonia is not excluded.
Lungs are otherwise clear. Small left pleural effusion. No
pneumothorax.

Upper Abdomen: Small hiatal hernia.

Musculoskeletal: No chest wall abnormality. No acute or significant
osseous findings.

Review of the MIP images confirms the above findings.
IMPRESSION: 1. Positive for acute pulmonary embolus. No right ventricular strain
by CT criteria.
2. Left lower lobe consolidation has a wedge-shaped configuration on
coronal reconstruction and may represent a small pulmonary infarct.
Pneumonia is also possible.
3. Small hiatal hernia.
These results were called by telephone at the time of interpretation
on 08/24/2016 at [DATE] to Dr. KRISHA CRUMB , who verbally
acknowledged these results.

By: Cherbin Casandra M.D.
